# Patient Record
Sex: Female | Born: 1995 | Race: White | Hispanic: No | Marital: Single | Smoking: Never smoker
Health system: Southern US, Community
[De-identification: ages and names within clinical notes are randomized; demographics above are authoritative.]

## PROBLEM LIST (undated history)

## (undated) HISTORY — PX: COLPOSCOPY: SHX161

## (undated) HISTORY — PX: WISDOM TOOTH EXTRACTION: SHX21

---
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
## 2016-12-12 ENCOUNTER — Ambulatory Visit: Payer: Self-pay

## 2018-01-04 ENCOUNTER — Encounter: Payer: Self-pay | Admitting: Women's Health

## 2018-01-04 ENCOUNTER — Ambulatory Visit (INDEPENDENT_AMBULATORY_CARE_PROVIDER_SITE_OTHER): Payer: BLUE CROSS/BLUE SHIELD | Admitting: Women's Health

## 2018-01-04 VITALS — BP 122/80 | Ht 66.0 in | Wt 122.0 lb

## 2018-01-04 DIAGNOSIS — N898 Other specified noninflammatory disorders of vagina: Secondary | ICD-10-CM

## 2018-01-04 DIAGNOSIS — Z3041 Encounter for surveillance of contraceptive pills: Secondary | ICD-10-CM

## 2018-01-04 DIAGNOSIS — Z01419 Encounter for gynecological examination (general) (routine) without abnormal findings: Secondary | ICD-10-CM

## 2018-01-04 LAB — WET PREP FOR TRICH, YEAST, CLUE

## 2018-01-04 MED ORDER — CRYSELLE-28 0.3-30 MG-MCG PO TABS: 1.0000 | ORAL_TABLET | Freq: Every day | ORAL | 4 refills | Status: DC

## 2018-01-04 NOTE — Progress Notes (Signed)
wet 

## 2018-01-04 NOTE — Progress Notes (Signed)
Yvette Aguilar 1996/04/18 [id number]    History:    22 y.o new patient presents for annual exam and contraception pill refill. On contraception pill, regular monthly cycle. No significant medical history. Reports 12/2016 pap smear, negative. Unknown HPV vaccination history.   Past medical history, past surgical history, family history and social history were all reviewed and documented in the EPIC chart. Never smoker. No significant family medical history. Grandfather: liver cancer, grandmother, lung cancer. Works as a Hydrologistphyscian therapist tech. [place]   ROS:  A ROS was performed and pertinent positives and negatives are included.  Exam:  Vitals:   01/04/18 0839  BP: 122/80  Weight: 122 lb (55.3 kg)  Height: 5\' 6"  (1.676 m)   Body mass index is 19.69 kg/m.   General appearance:  Normal Thyroid:  Symmetrical, normal in size, without palpable masses or nodularity. Respiratory  Auscultation:  Clear without wheezing or rhonchi Cardiovascular  Auscultation:  Regular rate, without rubs, murmurs or gallops  Edema/varicosities:  Not grossly evident Abdominal  Soft,nontender, without masses, guarding or rebound.  Liver/spleen:  No organomegaly noted  Hernia:  None appreciated  Skin  Inspection:  Grossly normal   Breasts: Examined lying and sitting.     Right: Without masses, retractions, discharge or axillary adenopathy.     Left: Without masses, retractions, discharge or axillary adenopathy. Gentitourinary   Inguinal/mons:  Normal without inguinal adenopathy  External genitalia:  Normal  BUS/Urethra/Skene's glands:  Normal  Vagina:  Normal moderate white curdy discharge, wet prep negative  Cervix:  Normal  Uterus: normal in size, shape and contour.  Midline and mobile  Adnexa/parametria:     Rt: Without masses or tenderness.   Lt: Without masses or tenderness.  Anus and perineum: Normal    Assessment/Plan:  [age] y.o. G0P0  for annual exam with  no complaints.    Monthly cycle, on contraception pill  Plan: Cryselle-28 0.3-30 mg prescribed. Reviewed slight risk for blood clots, strokes, same partner denies need for STD screen. Birth control methods reviewed. Instructed to check Gardasil history and make appointment for injection if not completed. SBE's, exercise, calcium rich diet, MVI daily encouraged. CBC, Pap    Harrington Challengerancy J Yeni Jiggetts [facility] HospitalWHNP, 9:35 AM 01/04/2018

## 2018-01-04 NOTE — Patient Instructions (Addendum)
Check about gardasil and if needed schedule appt only for injection Health Maintenance, Female Adopting a healthy lifestyle and getting preventive care can go a long way to promote health and wellness. Talk with your health care provider about what schedule of regular examinations is right for you. This is a good chance for you to check in with your provider about disease prevention and staying healthy. In between checkups, there are plenty of things you can do on your own. Experts have done a lot of research about which lifestyle changes and preventive measures are most likely to keep you healthy. Ask your health care provider for more information. Weight and diet Eat a healthy diet  Be sure to include plenty of vegetables, fruits, low-fat dairy products, and lean protein.  Do not eat a lot of foods high in solid fats, added sugars, or salt.  Get regular exercise. This is one of the most important things you can do for your health. ? Most adults should exercise for at least 150 minutes each week. The exercise should increase your heart rate and make you sweat (moderate-intensity exercise). ? Most adults should also do strengthening exercises at least twice a week. This is in addition to the moderate-intensity exercise.  Maintain a healthy weight  Body mass index (BMI) is a measurement that can be used to identify possible weight problems. It estimates body fat based on height and weight. Your health care provider can help determine your BMI and help you achieve or maintain a healthy weight.  For females 46 years of age and older: ? A BMI below 18.5 is considered underweight. ? A BMI of 18.5 to 24.9 is normal. ? A BMI of 25 to 29.9 is considered overweight. ? A BMI of 30 and above is considered obese.  Watch levels of cholesterol and blood lipids  You should start having your blood tested for lipids and cholesterol at 23 years of age, then have this test every 5 years.  You may need to  have your cholesterol levels checked more often if: ? Your lipid or cholesterol levels are high. ? You are older than 23 years of age. ? You are at high risk for heart disease.  Cancer screening Lung Cancer  Lung cancer screening is recommended for adults 37-36 years old who are at high risk for lung cancer because of a history of smoking.  A yearly low-dose CT scan of the lungs is recommended for people who: ? Currently smoke. ? Have quit within the past 15 years. ? Have at least a 30-pack-year history of smoking. A pack year is smoking an average of one pack of cigarettes a day for 1 year.  Yearly screening should continue until it has been 15 years since you quit.  Yearly screening should stop if you develop a health problem that would prevent you from having lung cancer treatment.  Breast Cancer  Practice breast self-awareness. This means understanding how your breasts normally appear and feel.  It also means doing regular breast self-exams. Let your health care provider know about any changes, no matter how small.  If you are in your 20s or 30s, you should have a clinical breast exam (CBE) by a health care provider every 1-3 years as part of a regular health exam.  If you are 25 or older, have a CBE every year. Also consider having a breast X-ray (mammogram) every year.  If you have a family history of breast cancer, talk to your health care  provider about genetic screening.  If you are at high risk for breast cancer, talk to your health care provider about having an MRI and a mammogram every year.  Breast cancer gene (BRCA) assessment is recommended for women who have family members with BRCA-related cancers. BRCA-related cancers include: ? Breast. ? Ovarian. ? Tubal. ? Peritoneal cancers.  Results of the assessment will determine the need for genetic counseling and BRCA1 and BRCA2 testing.  Cervical Cancer Your health care provider may recommend that you be screened  regularly for cancer of the pelvic organs (ovaries, uterus, and vagina). This screening involves a pelvic examination, including checking for microscopic changes to the surface of your cervix (Pap test). You may be encouraged to have this screening done every 3 years, beginning at age 21.  For women ages 30-65, health care providers may recommend pelvic exams and Pap testing every 3 years, or they may recommend the Pap and pelvic exam, combined with testing for human papilloma virus (HPV), every 5 years. Some types of HPV increase your risk of cervical cancer. Testing for HPV may also be done on women of any age with unclear Pap test results.  Other health care providers may not recommend any screening for nonpregnant women who are considered low risk for pelvic cancer and who do not have symptoms. Ask your health care provider if a screening pelvic exam is right for you.  If you have had past treatment for cervical cancer or a condition that could lead to cancer, you need Pap tests and screening for cancer for at least 20 years after your treatment. If Pap tests have been discontinued, your risk factors (such as having a new sexual partner) need to be reassessed to determine if screening should resume. Some women have medical problems that increase the chance of getting cervical cancer. In these cases, your health care provider may recommend more frequent screening and Pap tests.  Colorectal Cancer  This type of cancer can be detected and often prevented.  Routine colorectal cancer screening usually begins at 23 years of age and continues through 23 years of age.  Your health care provider may recommend screening at an earlier age if you have risk factors for colon cancer.  Your health care provider may also recommend using home test kits to check for hidden blood in the stool.  A small camera at the end of a tube can be used to examine your colon directly (sigmoidoscopy or colonoscopy). This is  done to check for the earliest forms of colorectal cancer.  Routine screening usually begins at age 50.  Direct examination of the colon should be repeated every 5-10 years through 23 years of age. However, you may need to be screened more often if early forms of precancerous polyps or small growths are found.  Skin Cancer  Check your skin from head to toe regularly.  Tell your health care provider about any new moles or changes in moles, especially if there is a change in a mole's shape or color.  Also tell your health care provider if you have a mole that is larger than the size of a pencil eraser.  Always use sunscreen. Apply sunscreen liberally and repeatedly throughout the day.  Protect yourself by wearing long sleeves, pants, a wide-brimmed hat, and sunglasses whenever you are outside.  Heart disease, diabetes, and high blood pressure  High blood pressure causes heart disease and increases the risk of stroke. High blood pressure is more likely to develop in: ?   People who have blood pressure in the high end of the normal range (130-139/85-89 mm Hg). ? People who are overweight or obese. ? People who are African American.  If you are 18-39 years of age, have your blood pressure checked every 3-5 years. If you are 40 years of age or older, have your blood pressure checked every year. You should have your blood pressure measured twice-once when you are at a hospital or clinic, and once when you are not at a hospital or clinic. Record the average of the two measurements. To check your blood pressure when you are not at a hospital or clinic, you can use: ? An automated blood pressure machine at a pharmacy. ? A home blood pressure monitor.  If you are between 55 years and 79 years old, ask your health care provider if you should take aspirin to prevent strokes.  Have regular diabetes screenings. This involves taking a blood sample to check your fasting blood sugar level. ? If you are  at a normal weight and have a low risk for diabetes, have this test once every three years after 23 years of age. ? If you are overweight and have a high risk for diabetes, consider being tested at a younger age or more often. Preventing infection Hepatitis B  If you have a higher risk for hepatitis B, you should be screened for this virus. You are considered at high risk for hepatitis B if: ? You were born in a country where hepatitis B is common. Ask your health care provider which countries are considered high risk. ? Your parents were born in a high-risk country, and you have not been immunized against hepatitis B (hepatitis B vaccine). ? You have HIV or AIDS. ? You use needles to inject street drugs. ? You live with someone who has hepatitis B. ? You have had sex with someone who has hepatitis B. ? You get hemodialysis treatment. ? You take certain medicines for conditions, including cancer, organ transplantation, and autoimmune conditions.  Hepatitis C  Blood testing is recommended for: ? Everyone born from 1945 through 1965. ? Anyone with known risk factors for hepatitis C.  Sexually transmitted infections (STIs)  You should be screened for sexually transmitted infections (STIs) including gonorrhea and chlamydia if: ? You are sexually active and are younger than 24 years of age. ? You are older than 24 years of age and your health care provider tells you that you are at risk for this type of infection. ? Your sexual activity has changed since you were last screened and you are at an increased risk for chlamydia or gonorrhea. Ask your health care provider if you are at risk.  If you do not have HIV, but are at risk, it may be recommended that you take a prescription medicine daily to prevent HIV infection. This is called pre-exposure prophylaxis (PrEP). You are considered at risk if: ? You are sexually active and do not regularly use condoms or know the HIV status of your  partner(s). ? You take drugs by injection. ? You are sexually active with a partner who has HIV.  Talk with your health care provider about whether you are at high risk of being infected with HIV. If you choose to begin PrEP, you should first be tested for HIV. You should then be tested every 3 months for as long as you are taking PrEP. Pregnancy  If you are premenopausal and you may become pregnant, ask your health   care provider about preconception counseling.  If you may become pregnant, take 400 to 800 micrograms (mcg) of folic acid every day.  If you want to prevent pregnancy, talk to your health care provider about birth control (contraception). Osteoporosis and menopause  Osteoporosis is a disease in which the bones lose minerals and strength with aging. This can result in serious bone fractures. Your risk for osteoporosis can be identified using a bone density scan.  If you are 65 years of age or older, or if you are at risk for osteoporosis and fractures, ask your health care provider if you should be screened.  Ask your health care provider whether you should take a calcium or vitamin D supplement to lower your risk for osteoporosis.  Menopause may have certain physical symptoms and risks.  Hormone replacement therapy may reduce some of these symptoms and risks. Talk to your health care provider about whether hormone replacement therapy is right for you. Follow these instructions at home:  Schedule regular health, dental, and eye exams.  Stay current with your immunizations.  Do not use any tobacco products including cigarettes, chewing tobacco, or electronic cigarettes.  If you are pregnant, do not drink alcohol.  If you are breastfeeding, limit how much and how often you drink alcohol.  Limit alcohol intake to no more than 1 drink per day for nonpregnant women. One drink equals 12 ounces of beer, 5 ounces of wine, or 1 ounces of hard liquor.  Do not use street  drugs.  Do not share needles.  Ask your health care provider for help if you need support or information about quitting drugs.  Tell your health care provider if you often feel depressed.  Tell your health care provider if you have ever been abused or do not feel safe at home. This information is not intended to replace advice given to you by your health care provider. Make sure you discuss any questions you have with your health care provider. Document Released: 04/27/2011 Document Revised: 03/19/2016 Document Reviewed: 07/16/2015 Elsevier Interactive Patient Education  2018 [employer] Inc[employer]  

## 2018-01-07 LAB — PAP IG W/ RFLX HPV ASCU

## 2018-01-07 LAB — HUMAN PAPILLOMAVIRUS, HIGH RISK: HPV DNA High Risk: DETECTED — AB

## 2018-02-02 ENCOUNTER — Encounter: Payer: Self-pay | Admitting: Obstetrics & Gynecology

## 2018-02-02 ENCOUNTER — Ambulatory Visit (INDEPENDENT_AMBULATORY_CARE_PROVIDER_SITE_OTHER): Payer: BLUE CROSS/BLUE SHIELD | Admitting: Obstetrics & Gynecology

## 2018-02-02 VITALS — BP 120/82 | Temp 99.0°F

## 2018-02-02 DIAGNOSIS — R87610 Atypical squamous cells of undetermined significance on cytologic smear of cervix (ASC-US): Secondary | ICD-10-CM

## 2018-02-02 DIAGNOSIS — Z113 Encounter for screening for infections with a predominantly sexual mode of transmission: Secondary | ICD-10-CM

## 2018-02-02 DIAGNOSIS — R87810 Cervical high risk human papillomavirus (HPV) DNA test positive: Secondary | ICD-10-CM

## 2018-02-02 NOTE — Progress Notes (Signed)
    Yvette Aguilar 11-20-1995 [id number]        22 y.o.  G0 Stable boyfriend x 2 yrs.  [place].  RP: ASCUS/HPV HR pos  HPI: Received Gardasil x 3 doses previously, brought records to document.  Previous Pap negative.  Pap 01/04/2018 ASCUS/HPV HR positive.   OB History  Gravida Para Term Preterm AB Living  0 0 0 0 0 0  SAB TAB Ectopic Multiple Live Births  0 0 0 0 0    Past medical history,surgical history, problem list, medications, allergies, family history and social history were all reviewed and documented in the EPIC chart.   Directed ROS with pertinent positives and negatives documented in the history of present illness/assessment and plan.  Exam:  Vitals:   02/02/18 0939  BP: 120/82  Temp: 99 F (37.2 C)  TempSrc: Oral   General appearance:  Normal  Colposcopy Procedure Note [name] 02/02/2018  Indications: ASCUS/HPV HR pos  Procedure Details  The risks and benefits of the procedure and Verbal informed consent obtained.  Speculum placed in vagina and excellent visualization of cervix achieved, cervix swabbed x 3 with acetic acid solution.  Findings:  Cervix colposcopy: Physical Exam  Genitourinary:      Vaginal colposcopy: Normal  Vulvar colposcopy: Grossly Normal  Perirectal colposcopy: Grossly Normal  The cervix was sprayed with Hurricane before performing the cervical biopsies.  Specimens: HPV 16-18-45.  Gono-Chlam.  Cervical Bx at 6 O'clock.  Complications: None, good hemostasis with Silver Nitrate . Plan: Per Cervical Bx and HPV 16-18-45 results.  If normal to CIN 1, will f/u with a repeat pap in 6 months.   Assessment/Plan:  22 y.o. G0P0000   1. ASCUS with positive high risk HPV cervical Colposcopy counseling done.  Colposcopy findings compatible with CIN-1, pending cervical biopsy.  Findings discussed with patient.  Risks and management of high risk HPV/cervical dysplasia  discussed with patient.  2. Screen for STD (sexually transmitted disease) Gono-Chlam done.  Strict condom use recommended.  Counseling on above issues and coordination of care more than 50% for 10 minutes.  [name] MD, 10:00 AM 02/02/2018

## 2018-02-06 ENCOUNTER — Encounter: Payer: Self-pay | Admitting: Obstetrics & Gynecology

## 2018-02-06 NOTE — Patient Instructions (Addendum)
1. ASCUS with positive high risk HPV cervical Colposcopy counseling done.  Colposcopy findings compatible with CIN-1, pending cervical biopsy.  Findings discussed with patient.  Risks and management of high risk HPV/cervical dysplasia discussed with patient.  2. Screen for STD (sexually transmitted disease) Gono-Chlam done.  Strict condom use recommended.  Earline, good seeing you today!  I will inform you of your results as soon as they are available.   Colposcopy, Care After This sheet gives you information about how to care for yourself after your procedure. Your health care provider may also give you more specific instructions. If you have problems or questions, contact your health care provider. What can I expect after the procedure? If you had a colposcopy without a biopsy, you can expect to feel fine right away, but you may have some spotting for a few days. You can go back to your normal activities. If you had a colposcopy with a biopsy, it is common to have:  Soreness and pain. This may last for a few days.  Light-headedness.  Mild vaginal bleeding or dark-colored, grainy discharge. This may last for a few days. The discharge may be due to a solution that was used during the procedure. You may need to wear a sanitary pad during this time.  Spotting for at least 48 hours after the procedure.  Follow these instructions at home:  Take over-the-counter and prescription medicines only as told by your health care provider. Talk with your health care provider about what type of over-the-counter pain medicine and prescription medicine you can start taking again. It is especially important to talk with your health care provider if you take blood-thinning medicine.  Do not drive or use heavy machinery while taking prescription pain medicine.  For at least 3 days after your procedure, or as long as told by your health care provider, avoid: ? Douching. ? Using tampons. ? Having sexual  intercourse.  Continue to use birth control (contraception).  Limit your physical activity for the first day after the procedure as told by your health care provider. Ask your health care provider what activities are safe for you.  It is up to you to get the results of your procedure. Ask your health care provider, or the department performing the procedure, when your results will be ready.  Keep all follow-up visits as told by your health care provider. This is important. Contact a health care provider if:  You develop a skin rash. Get help right away if:  You are bleeding heavily from your vagina or you are passing blood clots. This includes using more than one sanitary pad per hour for 2 hours in a row.  You have a fever or chills.  You have pelvic pain.  You have abnormal, yellow-colored, or bad-smelling vaginal discharge. This could be a sign of infection.  You have severe pain or cramps in your lower abdomen that do not get better with medicine.  You feel light-headed or dizzy, or you faint. Summary  If you had a colposcopy without a biopsy, you can expect to feel fine right away, but you may have some spotting for a few days. You can go back to your normal activities.  If you had a colposcopy with a biopsy, you may notice mild pain and spotting for 48 hours after the procedure.  Avoid douching, using tampons, and having sexual intercourse for 3 days after the procedure or as long as told by your health care provider.  Contact your  health care provider if you have bleeding, severe pain, or signs of infection. This information is not intended to replace advice given to you by your health care provider. Make sure you discuss any questions you have with your health care provider. Document Released: 08/02/2013 Document Revised: 05/29/2016 Document Reviewed: 05/29/2016 Elsevier Interactive Patient Education  2018 [employer].

## 2018-02-08 ENCOUNTER — Telehealth: Payer: Self-pay | Admitting: *Deleted

## 2018-02-08 NOTE — Telephone Encounter (Signed)
Dr Seymour BarsLavoie asked if HPV 16/18/45 be added to pap from 02/02/18. I called [facility], spoke to [place] and added the test with ICD 10 code N87.1 moderate cervical dysplasia.  KW CMA

## 2018-02-09 LAB — PAP THINPREP ASCUS RFLX HPV RFLX TYPE
C. TRACHOMATIS RNA, TMA: NOT DETECTED
N. gonorrhoeae RNA, TMA: NOT DETECTED

## 2018-02-09 LAB — HUMAN PAPILLOMAVIRUS, HIGH RISK: HPV DNA HIGH RISK: DETECTED — AB

## 2018-02-10 ENCOUNTER — Encounter: Payer: Self-pay | Admitting: Obstetrics & Gynecology

## 2018-02-10 ENCOUNTER — Ambulatory Visit (INDEPENDENT_AMBULATORY_CARE_PROVIDER_SITE_OTHER): Payer: BLUE CROSS/BLUE SHIELD | Admitting: Obstetrics & Gynecology

## 2018-02-10 VITALS — BP 118/74

## 2018-02-10 DIAGNOSIS — R87810 Cervical high risk human papillomavirus (HPV) DNA test positive: Secondary | ICD-10-CM | POA: Diagnosis not present

## 2018-02-10 DIAGNOSIS — N871 Moderate cervical dysplasia: Secondary | ICD-10-CM

## 2018-02-10 NOTE — Patient Instructions (Signed)
1. 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  Informed that in her age group, healthy women with good nutrition, are likely to have regression rather than progression of the dysplasia.  Folic acid supplement to boost her immune system recommended.  Importance of strict follow-up with colposcopy in 4 months emphasized.  The alternative to close follow-up with colposcopy in 4 months, which would be treating with a LEEP procedure, discussed with patient.  The LEEP procedure not recommended at this time because of the risk of causing cervical incompetence with future pregnancy, which would bring a risk of preterm birth.   Patient voiced understanding and agreement with the plan.  Will schedule follow-up colposcopy in August 2019.  2. Cervical high risk human papillomavirus (HPV) DNA test positive Pending HPV 16, 18 and 45.  Yvette Aguilar, good seeing you today!  I will inform you of your results as soon as they are available.

## 2018-02-10 NOTE — Progress Notes (Signed)
    Yvette Aguilar 12-20-1995 [id number]        22 y.o.  G0  RP: Discuss colposcopy results, CIN 2   HPI: Colposcopy on February 02, 2018.  Cervical biopsy showed CIN-2.  HPV 16, 18 and 45 pending.  No pelvic pain.  Normal vaginal secretions.   OB History  Gravida Para Term Preterm AB Living  0 0 0 0 0 0  SAB TAB Ectopic Multiple Live Births  0 0 0 0 0    Past medical history,surgical history, problem list, medications, allergies, family history and social history were all reviewed and documented in the EPIC chart.   Directed ROS with pertinent positives and negatives documented in the history of present illness/assessment and plan.  Exam:  There were no vitals filed for this visit. General appearance:  Normal  Patho cervical biopsy February 02, 2018: CIN 2   Assessment/Plan:  22 y.o. G0P0000   1. 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  Informed that in her age group, healthy women with good nutrition, are likely to have regression rather than progression of the dysplasia.  Folic acid supplement to boost her immune system recommended.  Importance of strict follow-up with colposcopy in 4 months emphasized.  The alternative to close follow-up with colposcopy in 4 months, which would be treating with a LEEP procedure, discussed with patient.  The LEEP procedure not recommended at this time because of the risk of causing cervical incompetence with future pregnancy, which would bring a risk of preterm birth.   Patient voiced understanding and agreement with the plan.  Will schedule follow-up colposcopy in August 2019.  2. Cervical high risk human papillomavirus (HPV) DNA test positive Pending HPV 16, 18 and 45.  Counseling on above issues and coordination of care more than 50% of 25 minutes.  Yvette DelMarie-Lyne Donneisha Beane MD, 9:17 AM 02/10/2018

## 2018-06-22 ENCOUNTER — Encounter: Payer: Self-pay | Admitting: Obstetrics & Gynecology

## 2018-06-22 ENCOUNTER — Ambulatory Visit (INDEPENDENT_AMBULATORY_CARE_PROVIDER_SITE_OTHER): Payer: BLUE CROSS/BLUE SHIELD | Admitting: Obstetrics & Gynecology

## 2018-06-22 VITALS — BP 128/86

## 2018-06-22 DIAGNOSIS — Z1151 Encounter for screening for human papillomavirus (HPV): Secondary | ICD-10-CM

## 2018-06-22 DIAGNOSIS — R87613 High grade squamous intraepithelial lesion on cytologic smear of cervix (HGSIL): Secondary | ICD-10-CM

## 2018-06-22 NOTE — Addendum Note (Signed)
Addended by: Berna SpareASTILLO, BLANCA A on: 06/22/2018 11:30 AM   Modules accepted: Orders

## 2018-06-22 NOTE — Progress Notes (Signed)
    Yvette Aguilar [birth year] [id number]        22 y.o.  G0 single.  [place]  RP: F/U Colposcopy for CIN 2  HPI: Well on Cryselle.  Had ASCUS/HPV HR pos 12/2017.  Colpo 01/2018 CIN 2.  HPV HR positive.  STI screen otherwise negative.  On Folic Acid supplements.  Gardasil received.   OB History  Gravida Para Term Preterm AB Living  0 0 0 0 0 0  SAB TAB Ectopic Multiple Live Births  0 0 0 0 0    Past medical history,surgical history, problem list, medications, allergies, family history and social history were all reviewed and documented in the EPIC chart.   Directed ROS with pertinent positives and negatives documented in the history of present illness/assessment and plan.  Exam:  Vitals:   06/22/18 1028  BP: 128/86   General appearance:  Normal  Colposcopy Procedure Note Malesha A Justice 06/22/2018  Indications: CIN 2 on Colpo 01/2018  Procedure Details  The risks and benefits of the procedure and Verbal informed consent obtained.  Speculum placed in vagina and excellent visualization of cervix achieved, cervix swabbed x 3 with acetic acid solution.  Findings:  Cervix colposcopy: Physical Exam  Genitourinary:      Vaginal colposcopy: Normal  Vulvar colposcopy: Grossly normal  Perirectal colposcopy: Grossly normal  The cervix was sprayed with Hurricane before performing the cervical biopsies.  Specimens: HPV 16-18-45.  Cervical biopsy at 6 and 8 O'clock  Complications: None, hemostasis with Silver Nitrate . Plan:  Per results of cervical biopsies   Assessment/Plan:  22 y.o. G0P0000   1. 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  HPV 16-18-45 done today.  Colposcopy findings discussed with patient.  Management per cervical biopsy results.  Genia DelMarie-Lyne Nova Evett MD, 10:43 AM 06/22/2018

## 2018-06-22 NOTE — Patient Instructions (Signed)
1. 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  HPV 16-18-45 done today.  Colposcopy findings discussed with patient.  Management per cervical biopsy results.  Yvette Aguilar, good seeing you today!  I will inform you of your results as soon as they are available.

## 2018-06-23 LAB — HPV TYPE 16 AND 18/45 RNA
HPV TYPE 16 RNA: NOT DETECTED
HPV Type 18/45 RNA: NOT DETECTED

## 2018-06-24 LAB — TISSUE PATH REPORT

## 2018-06-24 LAB — PATHOLOGY

## 2018-10-17 ENCOUNTER — Encounter: Payer: BLUE CROSS/BLUE SHIELD | Admitting: Women's Health

## 2019-01-09 ENCOUNTER — Other Ambulatory Visit: Payer: Self-pay | Admitting: Women's Health

## 2019-01-09 DIAGNOSIS — Z3041 Encounter for surveillance of contraceptive pills: Secondary | ICD-10-CM

## 2019-01-12 ENCOUNTER — Other Ambulatory Visit: Payer: Self-pay | Admitting: Women's Health

## 2019-01-12 DIAGNOSIS — Z3041 Encounter for surveillance of contraceptive pills: Secondary | ICD-10-CM

## 2019-04-03 ENCOUNTER — Other Ambulatory Visit: Payer: Self-pay | Admitting: Women's Health

## 2019-04-03 DIAGNOSIS — Z3041 Encounter for surveillance of contraceptive pills: Secondary | ICD-10-CM

## 2019-04-03 NOTE — Telephone Encounter (Signed)
Refill sent patient needs to schedule annual exam .

## 2019-04-03 NOTE — Telephone Encounter (Signed)
LM for patient to call and schedule annual.

## 2019-04-11 ENCOUNTER — Other Ambulatory Visit: Payer: Self-pay

## 2019-04-12 ENCOUNTER — Ambulatory Visit (INDEPENDENT_AMBULATORY_CARE_PROVIDER_SITE_OTHER): Payer: BC Managed Care – PPO | Admitting: Women's Health

## 2019-04-12 ENCOUNTER — Encounter: Payer: Self-pay | Admitting: Women's Health

## 2019-04-12 VITALS — BP 118/70 | Ht 65.5 in | Wt 117.0 lb

## 2019-04-12 DIAGNOSIS — Z3041 Encounter for surveillance of contraceptive pills: Secondary | ICD-10-CM

## 2019-04-12 DIAGNOSIS — N879 Dysplasia of cervix uteri, unspecified: Secondary | ICD-10-CM | POA: Diagnosis not present

## 2019-04-12 DIAGNOSIS — Z01419 Encounter for gynecological examination (general) (routine) without abnormal findings: Secondary | ICD-10-CM

## 2019-04-12 MED ORDER — CRYSELLE-28 0.3-30 MG-MCG PO TABS: 1.0000 | ORAL_TABLET | Freq: Every day | ORAL | 4 refills | Status: DC

## 2019-04-12 NOTE — Patient Instructions (Signed)

## 2019-04-12 NOTE — Progress Notes (Signed)
Yvette Aguilar 23-Sep-1996 [id number]    History:    Presents for annual exam.  Monthly cycle on Cryselle with no complaints.  12/2017 ASCUS with positive high risk HPV, 01/2018 CIN-2 on colpo and no dysplasia with mild atypia on colposcopy 05/2018.  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    Gardasil series completed.  Same partner with negative STD screen.  Past medical history, past surgical history, family history and social history were all reviewed and documented in the EPIC chart.  Living in [place] for occupational therapy school, doing well and enjoying.  Parents healthy.  ROS:  A ROS was performed and pertinent positives and negatives are included.  Exam:  Vitals:   04/12/19 1525  BP: 118/70  Weight: 117 lb (53.1 kg)  Height: 5' 5.5" (1.664 m)   Body mass index is 19.17 kg/m.   General appearance:  Normal Thyroid:  Symmetrical, normal in size, without palpable masses or nodularity. Respiratory  Auscultation:  Clear without wheezing or rhonchi Cardiovascular  Auscultation:  Regular rate, without rubs, murmurs or gallops  Edema/varicosities:  Not grossly evident Abdominal  Soft,nontender, without masses, guarding or rebound.  Liver/spleen:  No organomegaly noted  Hernia:  None appreciated  Skin  Inspection:  Grossly normal   Breasts: Examined lying and sitting.     Right: Without masses, retractions, discharge or axillary adenopathy.     Left: Without masses, retractions, discharge or axillary adenopathy. Gentitourinary   Inguinal/mons:  Normal without inguinal adenopathy  External genitalia:  Normal  BUS/Urethra/Skene's glands:  Normal  Vagina:  Normal  Cervix:  Normal  Uterus:   normal in size, shape and contour.  Midline and mobile  Adnexa/parametria:     Rt: Without masses or tenderness.   Lt: Without masses or tenderness.  Anus and perineum: Normal    Assessment/Plan:  23 y.o. S WF G0 for  annual exam with no complaints.  01/2018 CIN-2, 05/2018 mild dysplasia Monthly cycle on Cryselle  Plan: Cryselle prescription, proper use, slight risk for blood clots and strokes reviewed.  Condoms encouraged until permanent partner.  SBEs, exercise, calcium rich foods, MVI daily and increase sunscreen usage encouraged.  CBC.  Pap,  will triage based on results.    [place], 4:30 PM 04/12/2019

## 2019-04-13 LAB — CBC WITH DIFFERENTIAL/PLATELET
Absolute Monocytes: 569 cells/uL (ref 200–950)
Basophils Absolute: 58 cells/uL (ref 0–200)
Basophils Relative: 0.8 %
Eosinophils Absolute: 358 cells/uL (ref 15–500)
Eosinophils Relative: 4.9 %
HCT: 42.1 % (ref 35.0–45.0)
Hemoglobin: 14.1 g/dL (ref 11.7–15.5)
Lymphs Abs: 2015 cells/uL (ref 850–3900)
MCH: 31.5 pg (ref 27.0–33.0)
MCHC: 33.5 g/dL (ref 32.0–36.0)
MCV: 94 fL (ref 80.0–100.0)
MPV: 10.7 fL (ref 7.5–12.5)
Monocytes Relative: 7.8 %
Neutro Abs: 4300 cells/uL (ref 1500–7800)
Neutrophils Relative %: 58.9 %
Platelets: 280 10*3/uL (ref 140–400)
RBC: 4.48 10*6/uL (ref 3.80–5.10)
RDW: 12 % (ref 11.0–15.0)
Total Lymphocyte: 27.6 %
WBC: 7.3 10*3/uL (ref 3.8–10.8)

## 2019-04-18 LAB — PAP IG W/ RFLX HPV ASCU

## 2020-06-06 ENCOUNTER — Other Ambulatory Visit: Payer: Self-pay

## 2020-06-06 DIAGNOSIS — Z3041 Encounter for surveillance of contraceptive pills: Secondary | ICD-10-CM

## 2020-06-07 ENCOUNTER — Telehealth: Payer: Self-pay

## 2020-06-07 DIAGNOSIS — Z3041 Encounter for surveillance of contraceptive pills: Secondary | ICD-10-CM

## 2020-06-07 NOTE — Telephone Encounter (Signed)
Former pt of [place].  Refill on bcp's denied until she called to schedule her CE since overdue since June.  She called today to say she cannot come in. She lives in [place]. Was only home for a week and not able to come for her annual visit during that time.  She asked if she can have refill until she can find a gyn there in [place].

## 2020-06-09 NOTE — Telephone Encounter (Signed)
Yes that's fine. Please refill for 2 months. Thank you.

## 2020-06-10 MED ORDER — CRYSELLE-28 0.3-30 MG-MCG PO TABS: 1.0000 | ORAL_TABLET | Freq: Every day | ORAL | 1 refills | Status: AC

## 2020-06-10 NOTE — Telephone Encounter (Signed)
Spoke with patient. She said she schedule appt with Gyn in [place] for next month. Refills sent  per TW.

## 2020-07-01 ENCOUNTER — Other Ambulatory Visit: Payer: Self-pay | Admitting: Nurse Practitioner

## 2020-07-01 DIAGNOSIS — Z3041 Encounter for surveillance of contraceptive pills: Secondary | ICD-10-CM

## 2022-09-16 ENCOUNTER — Ambulatory Visit
Admission: EM | Admit: 2022-09-16 | Discharge: 2022-09-16 | Disposition: A | Discharge status: Home / Self Care | Payer: Commercial Managed Care - PPO | Attending: Family Medicine | Admitting: Family Medicine

## 2022-09-16 ENCOUNTER — Encounter: Payer: Self-pay | Admitting: Emergency Medicine

## 2022-09-16 DIAGNOSIS — J069 Acute upper respiratory infection, unspecified: Secondary | ICD-10-CM | POA: Diagnosis not present

## 2022-09-16 MED ORDER — FLUTICASONE PROPIONATE 50 MCG/ACT NA SUSP: 1.0000 | Freq: Two times a day (BID) | NASAL | 2 refills | Status: AC

## 2022-09-16 MED ORDER — AMOXICILLIN-POT CLAVULANATE 875-125 MG PO TABS: 1.0000 | ORAL_TABLET | Freq: Two times a day (BID) | ORAL | 0 refills | Status: AC

## 2022-09-16 MED ORDER — PREDNISONE 20 MG PO TABS: 40.0000 mg | ORAL_TABLET | Freq: Every day | ORAL | 0 refills | Status: AC

## 2022-09-16 NOTE — ED Provider Notes (Signed)
[facility]    CSN: [id number] Arrival date & time: 09/16/22  1057      History   Chief Complaint No chief complaint on file.   HPI Yvette Aguilar is a 26 y.o. female.   Presenting today with over a week of sore throat, nasal congestion, sinus pain and pressure, productive cough worsening the last few days.  She denies fever, chills, chest pain, shortness of breath.  Taking DayQuil and NyQuil with no relief.  No known pertinent chronic medical problems.    History reviewed. No pertinent past medical history.  Patient Active Problem List   Diagnosis Date Noted   Cervical dysplasia 04/12/2019    Past Surgical History:  Procedure Laterality Date   COLPOSCOPY     WISDOM TOOTH EXTRACTION      OB History     Gravida  0   Para  0   Term  0   Preterm  0   AB  0   Living  0      SAB  0   IAB  0   Ectopic  0   Multiple  0   Live Births  0            Home Medications    Prior to Admission medications   Medication Sig Start Date End Date Taking? Authorizing Provider  amoxicillin-clavulanate (AUGMENTIN) 875-125 MG tablet Take 1 tablet by mouth every 12 (twelve) hours. 09/16/22  Yes Particia Nearing, PA-C  fluticasone [facility]) 50 MCG/ACT nasal spray Place 1 spray into both nostrils 2 (two) times daily. 09/16/22  Yes Particia Nearing, PA-C  predniSONE (DELTASONE) 20 MG tablet Take 2 tablets (40 mg total) by mouth daily with breakfast. 09/16/22  Yes Particia Nearing, PA-C  norgestrel-ethinyl estradiol (CRYSELLE-28) 0.3-30 MG-MCG tablet Take 1 tablet by mouth daily. 06/10/20   Olivia Mackie, NP    Family History Family History  Problem Relation Age of Onset   Hypercholesterolemia Mother    Hypercholesterolemia Father    Cancer Maternal Grandmother        Lung   Cancer Paternal Grandfather        Liver    Social History Social History   Tobacco Use   Smoking status: Never   Smokeless tobacco: Never   Vaping Use   Vaping Use: Never used  Substance Use Topics   Alcohol use: Yes    Alcohol/week: 3.0 standard drinks of alcohol    Types: 3 Standard drinks or equivalent per week   Drug use: Never     Allergies   Patient has no known allergies.   Review of Systems Review of Systems Per HPI  Physical Exam Triage Vital Signs ED Triage Vitals  Enc Vitals Group     BP 09/16/22 1232 (!) 143/90     Pulse Rate 09/16/22 1232 61     Resp 09/16/22 1232 18     Temp 09/16/22 1232 98 F (36.7 C)     Temp Source 09/16/22 1232 Oral     SpO2 09/16/22 1232 98 %     Weight --      Height --      Head Circumference --      Peak Flow --      Pain Score 09/16/22 1233 0     Pain Loc --      Pain Edu? --      Excl. in GC? --    No data found.  Updated Vital  Signs BP (!) 143/90 (BP Location: Right Arm)   Pulse 61   Temp 98 F (36.7 C) (Oral)   Resp 18   LMP 09/07/2022 (Exact Date)   SpO2 98%   Visual Acuity Right Eye Distance:   Left Eye Distance:   Bilateral Distance:    Right Eye Near:   Left Eye Near:    Bilateral Near:     Physical Exam Vitals and nursing note reviewed.  Constitutional:      Appearance: Normal appearance.  HENT:     Head: Atraumatic.     Right Ear: Tympanic membrane and external ear normal.     Left Ear: Tympanic membrane and external ear normal.     Nose: Congestion present.     Mouth/Throat:     Mouth: Mucous membranes are moist.     Pharynx: Posterior oropharyngeal erythema present.  Eyes:     Extraocular Movements: Extraocular movements intact.     Conjunctiva/sclera: Conjunctivae normal.  Cardiovascular:     Rate and Rhythm: Normal rate and regular rhythm.     Heart sounds: Normal heart sounds.  Pulmonary:     Effort: Pulmonary effort is normal.     Breath sounds: Normal breath sounds. No wheezing or rales.  Musculoskeletal:        General: Normal range of motion.     Cervical back: Normal range of motion and neck supple.  Skin:     General: Skin is warm and dry.  Neurological:     Mental Status: She is alert and oriented to person, place, and time.  Psychiatric:        Mood and Affect: Mood normal.        Thought Content: Thought content normal.      UC Treatments / Results  Labs (all labs ordered are listed, but only abnormal results are displayed) Labs Reviewed - No data to display  EKG   Radiology No results found.  Procedures Procedures (including critical care time)  Medications Ordered in UC Medications - No data to display  Initial Impression / Assessment and Plan / UC Course  I have reviewed the triage vital signs and the nursing notes.  Pertinent labs & imaging results that were available during my care of the patient were reviewed by me and considered in my medical decision making (see chart for details).     Given duration and worsening course, will treat with a course of prednisone, Flonase in addition to her over-the-counter cold congestion medications.  Augmentin sent in case worsening or not resolving over the next 4 to 5 days.  Final Clinical Impressions(s) / UC Diagnoses   Final diagnoses:  Upper respiratory tract infection, unspecified type   Discharge Instructions   None    ED Prescriptions     Medication Sig Dispense Auth. Provider   predniSONE (DELTASONE) 20 MG tablet Take 2 tablets (40 mg total) by mouth daily with breakfast. 10 tablet Volney American, PA-C   amoxicillin-clavulanate (AUGMENTIN) 875-125 MG tablet Take 1 tablet by mouth every 12 (twelve) hours. 14 tablet Volney American, PA-C   fluticasone [facility]) 50 MCG/ACT nasal spray Place 1 spray into both nostrils 2 (two) times daily. 16 g Volney American, [place]      PDMP not reviewed this encounter.   Volney American, [place] 09/16/22 1753

## 2022-09-16 NOTE — ED Triage Notes (Signed)
Sore throat, congestion and cough since last week.  States symptoms have become worse.  Has been taking nyquil and dayquil and mucinex with some relief.
